# Patient Record
Sex: Male | Born: 1995 | Hispanic: No | Marital: Single | Smoking: Never smoker
Health system: Southern US, Community
[De-identification: ages and names within clinical notes are randomized; demographics above are authoritative.]

---
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
## 2017-01-25 ENCOUNTER — Emergency Department: Payer: Medicaid Other

## 2017-01-25 ENCOUNTER — Encounter: Payer: Self-pay | Admitting: *Deleted

## 2017-01-25 ENCOUNTER — Emergency Department
Admission: EM | Admit: 2017-01-25 | Discharge: 2017-01-25 | Disposition: A | Discharge status: Home / Self Care | Payer: Medicaid Other | Attending: Emergency Medicine | Admitting: Emergency Medicine

## 2017-01-25 DIAGNOSIS — S62642A Nondisplaced fracture of proximal phalanx of right middle finger, initial encounter for closed fracture: Secondary | ICD-10-CM | POA: Diagnosis not present

## 2017-01-25 DIAGNOSIS — Y939 Activity, unspecified: Secondary | ICD-10-CM | POA: Insufficient documentation

## 2017-01-25 DIAGNOSIS — S6991XA Unspecified injury of right wrist, hand and finger(s), initial encounter: Secondary | ICD-10-CM | POA: Diagnosis present

## 2017-01-25 DIAGNOSIS — Y929 Unspecified place or not applicable: Secondary | ICD-10-CM | POA: Insufficient documentation

## 2017-01-25 DIAGNOSIS — W500XXA Accidental hit or strike by another person, initial encounter: Secondary | ICD-10-CM | POA: Insufficient documentation

## 2017-01-25 DIAGNOSIS — Y999 Unspecified external cause status: Secondary | ICD-10-CM | POA: Insufficient documentation

## 2017-01-25 MED ORDER — TRAMADOL HCL 50 MG PO TABS: 50.0000 mg | ORAL_TABLET | Freq: Four times a day (QID) | ORAL | 0 refills | Status: AC | PRN

## 2017-01-25 MED ORDER — NAPROXEN 500 MG PO TBEC: 500.0000 mg | DELAYED_RELEASE_TABLET | Freq: Two times a day (BID) | ORAL | 0 refills | Status: AC
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
## 2023-03-01 ENCOUNTER — Emergency Department
Admission: EM | Admit: 2023-03-01 | Discharge: 2023-03-01 | Disposition: A | Discharge status: Home / Self Care | Payer: Self-pay | Attending: Emergency Medicine | Admitting: Emergency Medicine

## 2023-03-01 ENCOUNTER — Encounter: Payer: Self-pay | Admitting: Emergency Medicine

## 2023-03-01 ENCOUNTER — Other Ambulatory Visit: Payer: Self-pay

## 2023-03-01 DIAGNOSIS — Y9389 Activity, other specified: Secondary | ICD-10-CM | POA: Insufficient documentation

## 2023-03-01 DIAGNOSIS — S61432A Puncture wound without foreign body of left hand, initial encounter: Secondary | ICD-10-CM | POA: Insufficient documentation

## 2023-03-01 DIAGNOSIS — W460XXA Contact with hypodermic needle, initial encounter: Secondary | ICD-10-CM | POA: Insufficient documentation

## 2023-03-01 DIAGNOSIS — M7989 Other specified soft tissue disorders: Secondary | ICD-10-CM

## 2023-03-01 DIAGNOSIS — Z23 Encounter for immunization: Secondary | ICD-10-CM | POA: Insufficient documentation

## 2023-03-01 MED ORDER — TETANUS-DIPHTH-ACELL PERTUSSIS 5-2.5-18.5 LF-MCG/0.5 IM SUSY
0.5000 mL | PREFILLED_SYRINGE | Freq: Once | INTRAMUSCULAR | Status: AC
  Administered 2023-03-01: 0.5 mL via INTRAMUSCULAR
  Filled 2023-03-01: qty 0.5

## 2023-03-01 NOTE — ED Notes (Signed)
See triage note  Presents with pain and swelling to left hand /thumb area  States he stuck himself on Friday while at work  States it needle had a chemical  States he did do copious flushing  Swelling and tenderness noted
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
## 2023-03-01 NOTE — ED Provider Notes (Signed)
[facility] Provider Note    Event Date/Time   First MD Initiated Contact with Patient 03/01/23 1146     (approximate)   History   Chief Complaint Foreign Body in Skin   HPI  Geoffrey Farmer is a 27 y.o. male with no significant past medical history presents to the ED complaining of hand injury.  Patient reports that 3 days ago he accidentally injected sodium hydroxide into the palmar surface of his left hand, just proximal to the thumb.  Since then, he has had significant pain and swelling to the area, but denies any redness or drainage from the wound.  He states it is painful for him to move his thumb, but he can otherwise move his left hand without difficulty.  He reports there was some bruising in this area yesterday but this has since resolved.  He is unsure of his last tetanus shot.     Physical Exam   Triage Vital Signs: ED Triage Vitals  Enc Vitals Group     BP 03/01/23 1111 (!) 152/96     Pulse Rate 03/01/23 1111 81     Resp 03/01/23 1111 18     Temp 03/01/23 1111 98.4 F (36.9 C)     Temp src --      SpO2 03/01/23 1111 100 %     Weight --      Height --      Head Circumference --      Peak Flow --      Pain Score 03/01/23 1114 5     Pain Loc --      Pain Edu? --      Excl. in GC? --     Most recent vital signs: Vitals:   03/01/23 1111  BP: (!) 152/96  Pulse: 81  Resp: 18  Temp: 98.4 F (36.9 C)  SpO2: 100%    Constitutional: Alert and oriented. Eyes: Conjunctivae are normal. Head: Atraumatic. Nose: No congestion/rhinnorhea. Mouth/Throat: Mucous membranes are moist.  Cardiovascular: Normal rate, regular rhythm. Grossly normal heart sounds.  2+ radial pulses bilaterally.  Cap refill less than 2 seconds throughout digits of left hand. Respiratory: Normal respiratory effort.  No retractions. Lungs CTAB. Gastrointestinal: Soft and nontender. No distention. Musculoskeletal: Punctate wound to palmar surface of thenar muscles of  left hand with associated edema and mild tenderness.  No overlying erythema or warmth noted, range of motion intact throughout left hand.  No lower extremity tenderness nor edema.  Neurologic:  Normal speech and language. No gross focal neurologic deficits are appreciated.    ED Results / Procedures / Treatments   Labs (all labs ordered are listed, but only abnormal results are displayed) Labs Reviewed - No data to display   PROCEDURES:  Critical Care performed: No  Procedures   MEDICATIONS ORDERED IN ED: Medications  Tdap (BOOSTRIX) injection 0.5 mL (0.5 mLs Intramuscular Given 03/01/23 1217)     IMPRESSION / MDM / ASSESSMENT AND PLAN / ED COURSE  I reviewed the triage vital signs and the nursing notes.                              27 y.o. male with no significant past medical history who presents to the ED complaining of pain and swelling to his left hand after he accidentally injected sodium hydroxide 3 days ago.  Patient's presentation is most consistent with acute, uncomplicated illness.  Differential diagnosis includes,  but is not limited to, compartment syndrome, necrosis, cellulitis.  Patient well-appearing and in no acute distress, vital signs are unremarkable.  He has punctate wound with swelling and mild tenderness over the palmar thenar muscles but no signs of infection.  Pain is relatively mild and range of motion is intact, no findings concerning for compartment syndrome.  Case discussed with poison control, who states that nothing required beyond local wound care for sodium hydroxide injection.  We will update patient's tetanus and he was counseled on applying ice regularly for swelling.  He is appropriate for outpatient management, was counseled to return to the ED for new or worsening symptoms.  Patient agrees with plan.      FINAL CLINICAL IMPRESSION(S) / ED DIAGNOSES   Final diagnoses:  Hand swelling     Rx / DC Orders   ED Discharge Orders      None        Note:  This document was prepared using Dragon voice recognition software and may include unintentional dictation errors.   Chesley Noon, MD 03/01/23 1235
